# Patient Record
Sex: Male | Born: 1995 | Race: White | Hispanic: No | Marital: Single | Smoking: Never smoker
Health system: Southern US, Community
[De-identification: ages and names within clinical notes are randomized; demographics above are authoritative.]

---
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
## 2015-03-12 ENCOUNTER — Emergency Department (INDEPENDENT_AMBULATORY_CARE_PROVIDER_SITE_OTHER)
Admission: EM | Admit: 2015-03-12 | Discharge: 2015-03-12 | Disposition: A | Discharge status: Home / Self Care | Payer: Self-pay | Source: Home / Self Care | Attending: Emergency Medicine | Admitting: Emergency Medicine

## 2015-03-12 ENCOUNTER — Encounter (HOSPITAL_COMMUNITY): Payer: Self-pay | Admitting: Emergency Medicine

## 2015-03-12 DIAGNOSIS — Z91038 Other insect allergy status: Secondary | ICD-10-CM

## 2015-03-12 DIAGNOSIS — R21 Rash and other nonspecific skin eruption: Secondary | ICD-10-CM

## 2015-03-12 MED ORDER — PREDNISONE 20 MG PO TABS: ORAL_TABLET | ORAL | Status: AC

## 2015-03-12 NOTE — ED Notes (Signed)
Rash to both arms and face, patient has a bump on left forearm that is concerning patient

## 2015-03-12 NOTE — ED Provider Notes (Signed)
CSN: [id number]     Arrival date & time 03/12/15  1904 History   First MD Initiated Contact with Patient 03/12/15 1930     Chief Complaint  Patient presents with  . Rash   (Consider location/radiation/quality/duration/timing/severity/associated sxs/prior Treatment) HPI Comments: 19 year old male experienced a "fire ant bite" to the left volar forearm 48 hours ago. He states there was a larger area of swelling but since has decreased. There is currently a small vesicle without surrounding swelling or erythema. The patient is complaining of itching around the face and trunk and upper extremities. This is associated with some swelling and rash to the same areas. He has no complaints of shortness of breath, cough or sensation of intraoral swelling.   History reviewed. No pertinent past medical history. History reviewed. No pertinent past surgical history. No family history on file. Social History  Substance Use Topics  . Smoking status: Never Smoker   . Smokeless tobacco: None  . Alcohol Use: None    Review of Systems  Constitutional: Negative for fever, activity change and fatigue.  HENT: Negative.   Eyes: Negative.   Respiratory: Negative for cough, choking, shortness of breath and wheezing.   Cardiovascular: Negative for chest pain, palpitations and leg swelling.  Gastrointestinal: Negative.   Musculoskeletal: Negative.   Skin: Positive for rash.  Neurological: Negative for dizziness, numbness and headaches.    Allergies  Review of patient's allergies indicates no known allergies.  Home Medications   Prior to Admission medications   Medication Sig Start Date End Date Taking? Authorizing Provider  DiphenhydrAMINE HCl (BENADRYL ALLERGY PO) Take by mouth.   Yes Historical Provider, MD  ibuprofen (ADVIL,MOTRIN) 200 MG tablet Take 200 mg by mouth every 6 (six) hours as needed.    Historical Provider, MD  predniSONE (DELTASONE) 20 MG tablet Take 3 tabs po on first day, 2 tabs  second day, 2 tabs third day, 1 tab fourth day, 1 tab 5th day. Take with food. 03/12/15   Hayden Rasmussen, NP   Meds Ordered and Administered this Visit  Medications - No data to display  BP 125/74 mmHg  Pulse 61  Temp(Src) 98.3 F (36.8 C) (Oral)  Resp 18  SpO2 99% No data found.   Physical Exam  Constitutional: He is oriented to person, place, and time. He appears well-developed and well-nourished. No distress.  HENT:  Oropharynx with mild erythema and PND. No exudates. There is no oropharyngeal or other intraoral swelling, edema or erythema. Uvula is of normal size and color. No swelling of the tongue. Swallowing reflex is intact.  Eyes: Conjunctivae and EOM are normal.  Neck: Normal range of motion. Neck supple.  Cardiovascular: Normal rate, regular rhythm and normal heart sounds.   Pulmonary/Chest: Effort normal and breath sounds normal. No respiratory distress. He has no wheezes. He has no rales.  Musculoskeletal: Normal range of motion. He exhibits no tenderness.  Neurological: He is alert and oriented to person, place, and time. He exhibits normal muscle tone.  Skin: Skin is warm and dry.  There is minor puffiness to the paranasal and facial areas. No other areas of swelling identified. The overlying rash is mildly red, slightly rough and patchy. There is a ring of an erythematous rash along the waist were beltline. There are small papular vesicular lesions with minor erythema in the left interdigital web spaces.  Psychiatric: He has a normal mood and affect.  Nursing note and vitals reviewed.   ED Course  Procedures (including critical care time)  Labs Review Labs Reviewed - No data to display  Imaging Review No results found.   Visual Acuity Review  Right Eye Distance:   Left Eye Distance:   Bilateral Distance:    Right Eye Near:   Left Eye Near:    Bilateral Near:         MDM   1. 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 Rash due to allergy    May take allegra, Zyrtec  or Claritin for antihistamine treatment if benadryl causes too much drowsiness. Prednisone taper dose, Cold compreses, esp to face     Hayden Rasmussen, NP 03/12/15 1946

## 2015-03-12 NOTE — Discharge Instructions (Signed)
Allergies  Allergies may happen from anything your body is sensitive to. This may be food, medicines, pollens, chemicals, and many other things. Food allergies can be severe and deadly.  HOME CARE  If you do not know what causes a reaction, keep a diary. Write down the foods you ate and the symptoms that followed. Avoid foods that cause reactions.  If you have red raised spots (hives) or a rash:  Take medicine as told by your doctor.  Use medicines for red raised spots and itching as needed.  Apply cold cloths (compresses) to the skin. Take a cool bath. Avoid hot baths or showers.  If you are severely allergic:  It is often necessary to go to the hospital after you have treated your reaction.  Wear your medical alert jewelry.  You and your family must learn how to give a allergy shot or use an allergy kit (anaphylaxis kit).  Always carry your allergy kit or shot with you. Use this medicine as told by your doctor if a severe reaction is occurring. GET HELP RIGHT AWAY IF:  You have trouble breathing or are making high-pitched whistling sounds (wheezing).  You have a tight feeling in your chest or throat.  You have a puffy (swollen) mouth.  You have red raised spots, puffiness (swelling), or itching all over your body.  You have had a severe reaction that was helped by your allergy kit or shot. The reaction can return once the medicine has worn off.  You think you are having a food allergy. Symptoms most often happen within 30 minutes of eating a food.  Your symptoms have not gone away within 2 days or are getting worse.  You have new symptoms.  You want to retest yourself with a food or drink you think causes an allergic reaction. Only do this under the care of a doctor. MAKE SURE YOU:   Understand these instructions.  Will watch your condition.  Will get help right away if you are not doing well or get worse. Document Released: 09/28/2012 Document Reviewed:  09/28/2012 [facility] Patient Information 2015 [place]. This information is not intended to replace advice given to you by your health care provider. Make sure you discuss any questions you have with your health care provider.

## 2015-08-20 ENCOUNTER — Emergency Department (INDEPENDENT_AMBULATORY_CARE_PROVIDER_SITE_OTHER): Payer: BC Managed Care – PPO

## 2015-08-20 ENCOUNTER — Encounter (HOSPITAL_COMMUNITY): Payer: Self-pay | Admitting: Emergency Medicine

## 2015-08-20 ENCOUNTER — Emergency Department (HOSPITAL_COMMUNITY)
Admission: EM | Admit: 2015-08-20 | Discharge: 2015-08-20 | Disposition: A | Discharge status: Home / Self Care | Payer: BC Managed Care – PPO | Source: Home / Self Care | Attending: Family Medicine | Admitting: Family Medicine

## 2015-08-20 DIAGNOSIS — R079 Chest pain, unspecified: Secondary | ICD-10-CM

## 2015-08-20 NOTE — Discharge Instructions (Signed)
Chest Wall Pain °Chest wall pain is pain in or around the bones and muscles of your chest. Sometimes, an injury causes this pain. Sometimes, the cause may not be known. This pain may take several weeks or longer to get better. °HOME CARE °Pay attention to any changes in your symptoms. Take these actions to help with your pain: °· Rest as told by your doctor. °· Avoid activities that cause pain. Try not to use your chest, belly (abdominal), or side muscles to lift heavy things. °· If directed, apply ice to the painful area: °¨ Put ice in a plastic bag. °¨ Place a towel between your skin and the bag. °¨ Leave the ice on for 20 minutes, 2-3 times per day. °· Take over-the-counter and prescription medicines only as told by your doctor. °· Do not use tobacco products, including cigarettes, chewing tobacco, and e-cigarettes. If you need help quitting, ask your doctor. °· Keep all follow-up visits as told by your doctor. This is important. °GET HELP IF: °· You have a fever. °· Your chest pain gets worse. °· You have new symptoms. °GET HELP RIGHT AWAY IF: °· You feel sick to your stomach (nauseous) or you throw up (vomit). °· You feel sweaty or light-headed. °· You have a cough with phlegm (sputum) or you cough up blood. °· You are short of breath. °  °This information is not intended to replace advice given to you by your health care provider. Make sure you discuss any questions you have with your health care provider. °  °Document Released: 11/20/2007 Document Revised: 02/22/2015 Document Reviewed: 08/29/2014 °Elsevier Interactive Patient Education ©2016 [employer] ° °Costochondritis °Costochondritis is a condition in which the tissue (cartilage) that connects your ribs with your breastbone (sternum) becomes irritated. It causes pain in the chest and rib area. It usually goes away on its own over time. °HOME CARE °· Avoid activities that wear you out. °· Do not strain your ribs. Avoid activities that use  your: °¨ Chest. °¨ Belly. °¨ Side muscles. °· Put ice on the area for the first 2 days after the pain starts. °¨ Put ice in a plastic bag. °¨ Place a towel between your skin and the bag. °¨ Leave the ice on for 20 minutes, 2-3 times a day. °· Only take medicine as told by your doctor. °GET HELP IF: °· You have redness or puffiness (swelling) in the rib area. °· Your pain does not go away with rest or medicine. °GET HELP RIGHT AWAY IF:  °· Your pain gets worse. °· You are very uncomfortable. °· You have trouble breathing. °· You cough up blood. °· You start sweating or throwing up (vomiting). °· You have a fever or lasting symptoms for more than 2-3 days. °· You have a fever and your symptoms suddenly get worse. °MAKE SURE YOU:  °· Understand these instructions. °· Will watch your condition. °· Will get help right away if you are not doing well or get worse. °  °This information is not intended to replace advice given to you by your health care provider. Make sure you discuss any questions you have with your health care provider. °  °Document Released: 11/20/2007 Document Revised: 02/03/2013 Document Reviewed: 01/05/2013 °Elsevier Interactive Patient Education ©2016 Elsevier Inc. ° °

## 2015-08-20 NOTE — ED Notes (Signed)
The patient presented to the [facility] with a complaint of chest wall pain that was bilateral and below the nipple line that started last evening. The patient stated that it does get worse when he lays on his right shoulder and does get worse upon inspiration. The patient denied any cardiac history.

## 2015-08-20 NOTE — ED Provider Notes (Signed)
CSN: [id number]     Arrival date & time 08/20/15  1303 History   First MD Initiated Contact with Patient 08/20/15 1332     Chief Complaint  Patient presents with  . Pleurisy   (Consider location/radiation/quality/duration/timing/severity/associated sxs/prior Treatment) HPI Chief complaint chest pain shortness of breath. 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 It worsened throughout the evening; he took 2 Tums with only mild relief. The pain woke him in the night; it is painful to lie on his right and left side. The pain is constant, 3-6/10, dull, with occasional sharp pains lasting 3-5 seconds. He has had a few episodes of shallow breathing. The pain is intermittently associated with inspiration. He has intermittent nausea. He denies trauma or inciting event, headache, dizziness, loss of consciousness, vision changes, recent URI, fever, cough, sore throat, abdominal pain, vomiting.  He denies cardiac, pulmonary, HTN history. His mother has a "valve" issue. No one in his family has died young of sudden MI.  In addition to working at [employer], he works at [employer]. He smokes 1 pack/month, marijuana once/week, drinks 12-24 beers per week. No recent increase in alcohol consumption; he is drinking less to save money.  He is worried he is coming down with flu. He is accompanied by Victorino DikeJennifer; she is a mother figure to him and he lives with her family. She works at [employer]. She is worried he is having a cardiac event.   History reviewed. No pertinent past medical history. History reviewed. No pertinent past surgical history. History reviewed. No pertinent family history. Social History  Substance Use Topics  . Smoking status: Never Smoker   . Smokeless tobacco: None  . Alcohol Use: None    Review of Systems See HPI   Allergies  Review of patient's allergies indicates no known allergies.  Home  Medications   Prior to Admission medications   Medication Sig Start Date End Date Taking? Authorizing Provider  DiphenhydrAMINE HCl (BENADRYL ALLERGY PO) Take by mouth.    Historical Provider, MD  ibuprofen (ADVIL,MOTRIN) 200 MG tablet Take 200 mg by mouth every 6 (six) hours as needed.    Historical Provider, MD  predniSONE (DELTASONE) 20 MG tablet Take 3 tabs po on first day, 2 tabs second day, 2 tabs third day, 1 tab fourth day, 1 tab 5th day. Take with food. 03/12/15   Hayden Rasmussenavid Mabe, NP   Meds Ordered and Administered this Visit  Medications - No data to display  BP 147/92 mmHg  Pulse 52  Temp(Src) 97.3 F (36.3 C) (Oral)  Resp 18  SpO2 100% No data found.   Physical Exam Eyes: PERRL, EOM's intact ENT: Old scarring on TM's bilaterally as an arc around outer edges from 2 o'clock to 10 o'clock; otherwise TM's gray, cone of light intact; nasal turbinates non-erythematous; pharynx non-erythematous; no cervical lymphadenopathy CV: Regular rhythm; bradycardic; no murmurs, rubs, gallops Pulm: CTAB in all lung fields, no wheezes, rales, rhonchi Chest wall mildly tender to palpation left side below nipple; no deformities palpated. 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 EXAM: CHEST  2  VIEW COMPARISON:  None FINDINGS: Normal heart size, mediastinal contours, and pulmonary vascularity. Lungs hyperinflated but clear. No pulmonary infiltrate, pleural effusion or pneumothorax. Bones unremarkable. IMPRESSION: Hyperinflated lungs without acute infiltrate. Electronically Signed   By: Ulyses Southward M.D.   On: 08/20/2015 14:36     Visual Acuity Review  Right Eye Distance:   Left Eye Distance:    Bilateral Distance:    Right Eye Near:   Left Eye Near:    Bilateral Near:         Discussed result of CXR with jennifer and patient.  MDM   1. Chest pain, unspecified chest pain type    EKG shows regular rhythm, HR 47, no ST changes, no acute abnormalities.  Given his history and no abnormal findings on EKG and chest xray, I believe his pain is due to costochondritis and will discharge him home with instructions to take Ibuprofen and use heat. His blood pressure 147/92 may be due to pain; I have recommended that he follow-up on his blood pressure with his PCP or other provider within 7-10 days.  His friend Victorino Dike is still not reassured despite lengthy discussion of the EKG and chest xray results along with his clinical picture. They were instructed that if they feel the need for further workup (troponins), they will have to go to the ED, as we don't draw troponins here. They have decided he will be discharged home from here.    Tharon Aquas, PA 08/20/15 1538

## 2016-11-01 IMAGING — DX DG CHEST 2V
2 series · 2 of 2 positions shown · non-contrast
Comparison: None

CLINICAL DATA: LEFT lower chest pain beginning last night,
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
and works out.

EXAM:
CHEST  2 VIEW

[chest pa]
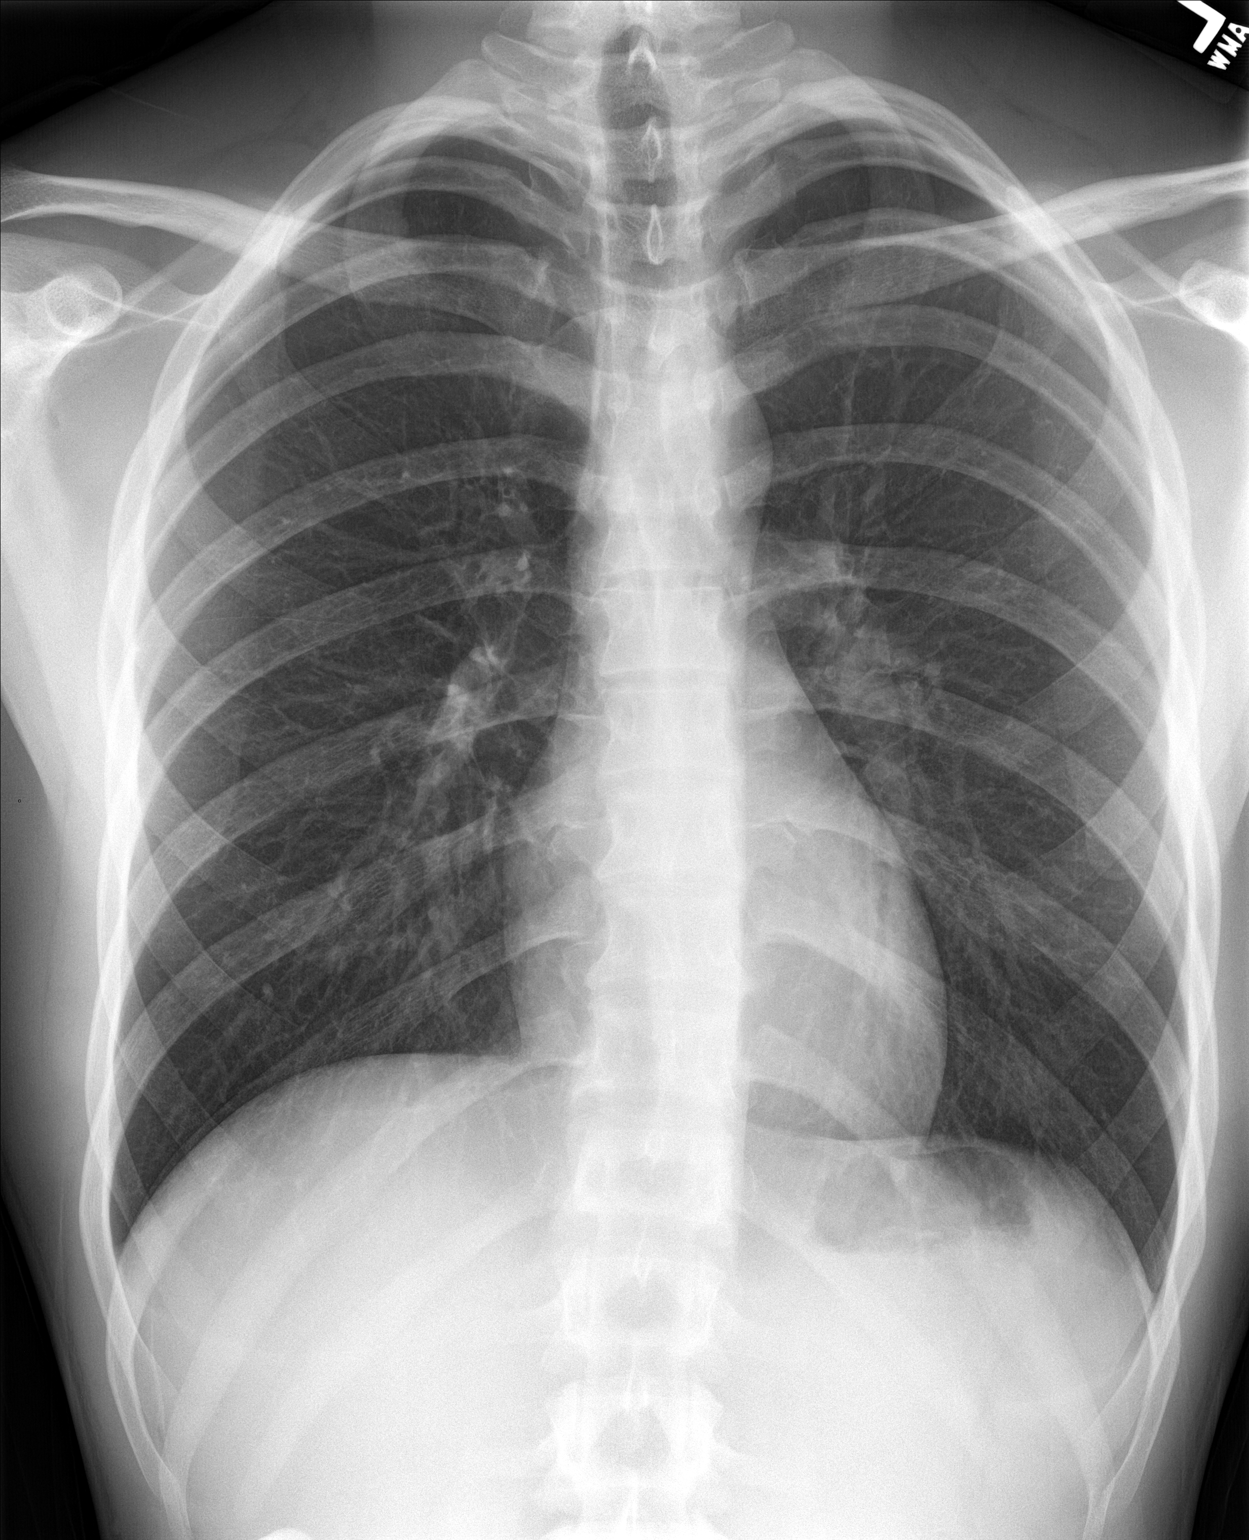

[chest lat]
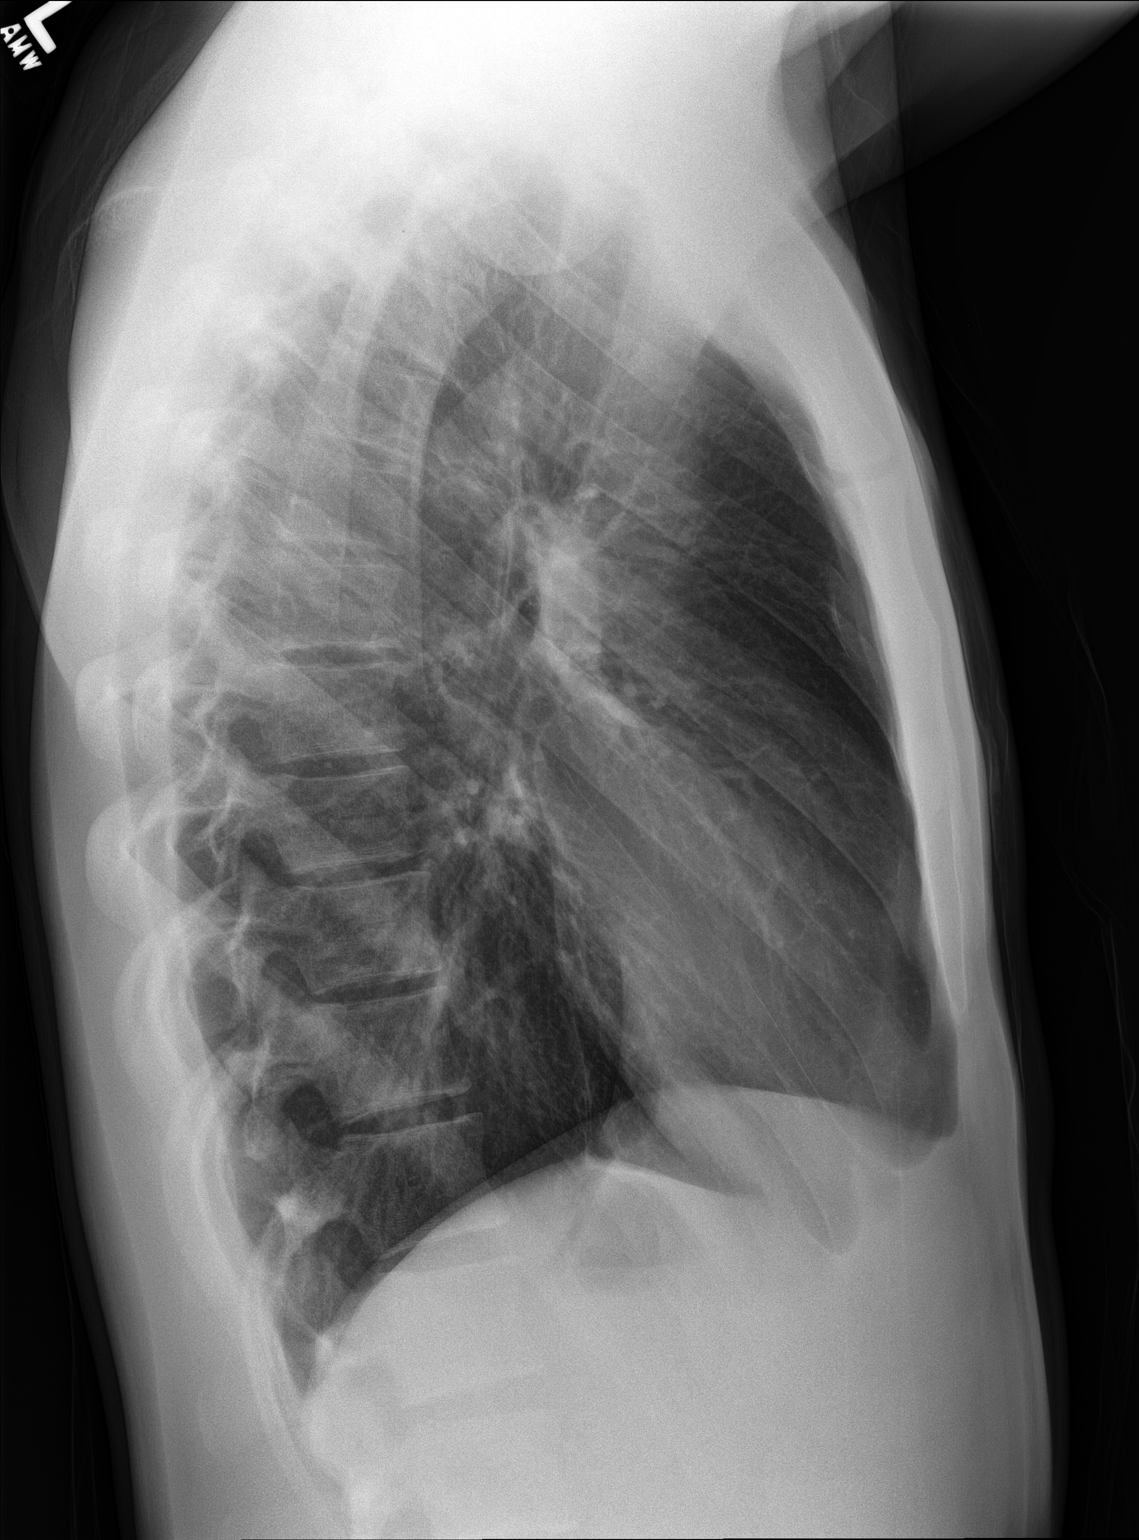

[2 of 2 positions shown; findings below may reference images not displayed]

FINDINGS: Normal heart size, mediastinal contours, and pulmonary vascularity.

Lungs hyperinflated but clear.

No pulmonary infiltrate, pleural effusion or pneumothorax.

Bones unremarkable.
IMPRESSION: Hyperinflated lungs without acute infiltrate.

## 2017-06-16 ENCOUNTER — Other Ambulatory Visit: Payer: Self-pay

## 2017-06-16 ENCOUNTER — Emergency Department (HOSPITAL_COMMUNITY)
Admission: EM | Admit: 2017-06-16 | Discharge: 2017-06-16 | Disposition: A | Discharge status: Home / Self Care | Payer: BC Managed Care – PPO
